# Patient Record
Sex: Male | Born: 1995 | Race: Black or African American | Hispanic: No | Marital: Single | Smoking: Never smoker
Health system: Southern US, Community
[De-identification: ages and names within clinical notes are randomized; demographics above are authoritative.]

## PROBLEM LIST (undated history)

## (undated) DIAGNOSIS — Z0000 Encounter for general adult medical examination without abnormal findings: Secondary | ICD-10-CM

## (undated) HISTORY — DX: Encounter for general adult medical examination without abnormal findings: Z00.00

---
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
## 2017-05-17 ENCOUNTER — Ambulatory Visit: Payer: Self-pay | Admitting: Family Medicine

## 2017-05-19 ENCOUNTER — Ambulatory Visit (INDEPENDENT_AMBULATORY_CARE_PROVIDER_SITE_OTHER): Payer: BLUE CROSS/BLUE SHIELD | Admitting: Family Medicine

## 2017-05-19 ENCOUNTER — Other Ambulatory Visit (HOSPITAL_COMMUNITY)
Admission: RE | Admit: 2017-05-19 | Discharge: 2017-05-19 | Disposition: A | Discharge status: Home / Self Care | Payer: BLUE CROSS/BLUE SHIELD | Source: Ambulatory Visit | Attending: Family Medicine | Admitting: Family Medicine

## 2017-05-19 ENCOUNTER — Encounter: Payer: Self-pay | Admitting: Family Medicine

## 2017-05-19 VITALS — BP 88/60 | HR 87 | Temp 98.3°F | Ht 79.5 in | Wt 201.0 lb

## 2017-05-19 DIAGNOSIS — Z832 Family history of diseases of the blood and blood-forming organs and certain disorders involving the immune mechanism: Secondary | ICD-10-CM

## 2017-05-19 DIAGNOSIS — Z13220 Encounter for screening for lipoid disorders: Secondary | ICD-10-CM

## 2017-05-19 DIAGNOSIS — Z7251 High risk heterosexual behavior: Secondary | ICD-10-CM

## 2017-05-19 DIAGNOSIS — Z0000 Encounter for general adult medical examination without abnormal findings: Secondary | ICD-10-CM | POA: Insufficient documentation

## 2017-05-19 DIAGNOSIS — Z23 Encounter for immunization: Secondary | ICD-10-CM | POA: Diagnosis not present

## 2017-05-19 NOTE — Progress Notes (Signed)
Phone: [phone]  Subjective:  Patient presents today to establish care.  Prior patient of pediatrician. Chief complaint-noted.   See problem oriented charting  The following were reviewed and entered/updated in epic: Past Medical History:  Diagnosis Date  . Healthy adult on routine physical examination    Past Surgical History:  Procedure Laterality Date  . ORIF TIBIA PLATEAU Left 11/13/2015   Procedure: OPEN REDUCTION INTERNAL FIXATION (ORIF) LEFT TIBIAL PLATEAU;  Surgeon: Dannielle Huh, MD;  Location: [facility] OR;  Service: Orthopedics;  Laterality: Left;    Family History  Problem Relation Age of Onset  . Hyperlipidemia Mother   . Anemia Mother        heavy periods  . Healthy Father   . Anemia Sister        heavy periods    Medications- reviewed and updated No current outpatient prescriptions on file.   No current facility-administered medications for this visit.     Allergies-reviewed and updated No Known Allergies  Social History   Social History  . Marital status: Single    Spouse name: N/A  . Number of children: N/A  . Years of education: N/A   Social History Main Topics  . Smoking status: Never Smoker  . Smokeless tobacco: Never Used  . Alcohol use No  . Drug use: No  . Sexual activity: Not Asked   Other Topics Concern  . None   Social History Narrative   Single. 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    Getting scholarship money.    Plans to try to walk on to basketball team.    Fractured tibia 2 years ago- became interested in orthopedics      Hobbies: workout, play basketball, video game- fortnite in 2018, also enjoys 2018    ROS--Full ROS was completed Review of Systems  Constitutional: Negative for chills and fever.  HENT: Negative for hearing loss and tinnitus.   Eyes: Negative for blurred vision and double vision.  Respiratory: Negative for cough and hemoptysis.     Cardiovascular: Negative for chest pain and palpitations.  Gastrointestinal: Negative for heartburn and nausea.  Genitourinary: Negative for dysuria and urgency.  Musculoskeletal: Negative for myalgias and neck pain.  Skin: Negative for itching and rash.  Neurological: Negative for dizziness and headaches.  Endo/Heme/Allergies: Negative for polydipsia. Does not bruise/bleed easily.  Psychiatric/Behavioral: Negative for hallucinations and substance abuse.  not lightheaded with current BP  Objective: BP (!) 88/60 (BP Location: Left Arm, Patient Position: Sitting, Cuff Size: Large)   Pulse 87   Temp 98.3 F (36.8 C) (Oral)   Ht 6' 7.5" (2.019 m)   Wt 201 lb (91.2 kg)   SpO2 98%   BMI 22.36 kg/m  Gen: NAD, resting comfortably HEENT: Mucous membranes are moist. Oropharynx normal. TM normal. Eyes: sclera and lids normal, PERRLA Neck: no thyromegaly, no cervical lymphadenopathy CV: RRR no murmurs rubs or gallops Lungs: CTAB no crackles, wheeze, rhonchi Abdomen: soft/nontender/nondistended/normal bowel sounds. No rebound or guarding.  Ext: no edema Skin: warm, dry Neuro: 5/5 strength in upper and lower extremities, normal gait, normal reflexes  Assessment/Plan:  21 y.o. male presenting for annual physical.  Health Maintenance counseling: 1. Anticipatory guidance: Patient counseled regarding regular dental exams q6 months, eye exams - wears contacts- sees yearly, wearing seatbelts.  2. Risk factor reduction:  Advised patient of need for regular exercise and diet rich and fruits and vegetables to reduce risk  of heart attack and stroke. Exercise- excellent- regularly plays basketball. Diet-balanced diet.  Wt Readings from Last 3 Encounters:  05/19/17 201 lb (91.2 kg) (94 %, Z= 1.54)*  11/13/15 185 lb (83.9 kg) (92 %, Z= 1.37)*  3. Immunizations/screenings/ancillary studies- advised flu shot in the fall. Bexsero and menveo today. Repeat bexsero within 6 months.  4. Prostate cancer  screening- no family history, start at age 19-50 5. Colon cancer screening -  no family history, start at age 19-50 6. Testicular cancer screening- advised monthly self exams  7. STD screening- patient opts in. Discussed HIV screening  Status of chronic or acute concerns   No acute concerns. I was concerned about unprotected sex and encouraged STD screening minus Hep B as immunized- no groin lesions.   Preventative health care - Plan: CBC, Comprehensive metabolic panel, Lipid panel, HIV antibody, RPR, Urine cytology ancillary only  Unprotected sex - Plan: CBC, Comprehensive metabolic panel, HIV antibody, RPR, Urine cytology ancillary only  Screening for hyperlipidemia - Plan: Lipid panel  Family history of anemia - Plan: CBC   Return in about 1 year (around 05/19/2018) for physical.  Orders Placed This Encounter  Procedures  . CBC    [place]  . Comprehensive metabolic panel    [place]    Order Specific Question:   Has the patient fasted?    Answer:   No  . Lipid panel        Order Specific Question:   Has the patient fasted?    Answer:   No  . HIV antibody    [facility]  . R[facility]    [facility]   Return precautions advised.  Tana ConchStephen Ziquan Fidel, MD

## 2017-05-19 NOTE — Patient Instructions (Signed)
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

## 2017-05-19 NOTE — Addendum Note (Signed)
Addended by: Vicente MalesSOUTHERN HIZER, JAMIE M on: 05/19/2017 04:12 PM   Modules accepted: Orders

## 2017-05-20 LAB — COMPREHENSIVE METABOLIC PANEL
ALT: 18 U/L (ref 0–53)
AST: 29 U/L (ref 0–37)
Albumin: 3.9 g/dL (ref 3.5–5.2)
Alkaline Phosphatase: 78 U/L (ref 52–171)
BILIRUBIN TOTAL: 0.5 mg/dL (ref 0.3–1.2)
BUN: 13 mg/dL (ref 6–23)
CALCIUM: 9.4 mg/dL (ref 8.4–10.5)
CHLORIDE: 105 meq/L (ref 96–112)
CO2: 28 meq/L (ref 19–32)
CREATININE: 1.15 mg/dL (ref 0.40–1.50)
GFR: 105.78 mL/min (ref >60.00)
Glucose, Bld: 97 mg/dL (ref 70–99)
Potassium: 4.4 mEq/L (ref 3.5–5.1)
SODIUM: 138 meq/L (ref 135–145)
Total Protein: 6.8 g/dL (ref 6.0–8.3)

## 2017-05-20 LAB — CBC
HCT: 37.5 % (ref 36.0–49.0)
Hemoglobin: 12 g/dL (ref 12.0–16.0)
MCHC: 32.1 g/dL (ref 31.0–37.0)
MCV: 83.2 fl (ref 78.0–98.0)
PLATELETS: 208 10*3/uL (ref 150.0–575.0)
RBC: 4.5 Mil/uL (ref 3.80–5.70)
RDW: 14.7 % (ref 11.4–15.5)
WBC: 5.1 10*3/uL (ref 4.5–13.5)

## 2017-05-20 LAB — HIV ANTIBODY (ROUTINE TESTING W REFLEX): HIV: NONREACTIVE

## 2017-05-20 LAB — RPR

## 2017-05-20 LAB — LIPID PANEL
CHOL/HDL RATIO: 3
Cholesterol: 125 mg/dL (ref 0–200)
HDL: 44.1 mg/dL (ref >39.00)
LDL CALC: 73 mg/dL (ref 0–99)
NONHDL: 80.98
Triglycerides: 41 mg/dL (ref 0.0–149.0)
VLDL: 8.2 mg/dL (ref 0.0–40.0)

## 2017-05-23 LAB — URINE CYTOLOGY ANCILLARY ONLY
CHLAMYDIA, DNA PROBE: NEGATIVE
Neisseria Gonorrhea: NEGATIVE
Trichomonas: NEGATIVE

## 2017-05-26 ENCOUNTER — Telehealth: Payer: Self-pay | Admitting: Family Medicine

## 2017-05-26 NOTE — Telephone Encounter (Signed)
Mom reports he never had HPV #3.   We will plan on giving this along with menveo at follow up.

## 2017-10-21 DIAGNOSIS — R509 Fever, unspecified: Secondary | ICD-10-CM | POA: Diagnosis not present

## 2017-10-21 DIAGNOSIS — Z743 Need for continuous supervision: Secondary | ICD-10-CM | POA: Diagnosis not present

## 2017-10-21 DIAGNOSIS — B349 Viral infection, unspecified: Secondary | ICD-10-CM | POA: Diagnosis not present

## 2017-10-23 DIAGNOSIS — J039 Acute tonsillitis, unspecified: Secondary | ICD-10-CM | POA: Diagnosis not present

## 2020-09-11 DIAGNOSIS — Z113 Encounter for screening for infections with a predominantly sexual mode of transmission: Secondary | ICD-10-CM | POA: Diagnosis not present

## 2021-01-03 DIAGNOSIS — Z113 Encounter for screening for infections with a predominantly sexual mode of transmission: Secondary | ICD-10-CM | POA: Diagnosis not present

## 2021-04-21 DIAGNOSIS — H43392 Other vitreous opacities, left eye: Secondary | ICD-10-CM | POA: Diagnosis not present

## 2021-04-21 DIAGNOSIS — H35413 Lattice degeneration of retina, bilateral: Secondary | ICD-10-CM | POA: Diagnosis not present

## 2021-04-21 DIAGNOSIS — H359 Unspecified retinal disorder: Secondary | ICD-10-CM | POA: Diagnosis not present

## 2021-04-25 DIAGNOSIS — H33323 Round hole, bilateral: Secondary | ICD-10-CM | POA: Diagnosis not present

## 2021-04-25 DIAGNOSIS — H33049 Retinal detachment with retinal dialysis, unspecified eye: Secondary | ICD-10-CM | POA: Diagnosis not present

## 2021-04-30 DIAGNOSIS — H359 Unspecified retinal disorder: Secondary | ICD-10-CM | POA: Diagnosis not present

## 2021-04-30 DIAGNOSIS — H33322 Round hole, left eye: Secondary | ICD-10-CM | POA: Diagnosis not present

## 2021-10-15 DIAGNOSIS — Z111 Encounter for screening for respiratory tuberculosis: Secondary | ICD-10-CM | POA: Diagnosis not present

## 2021-10-18 DIAGNOSIS — Z111 Encounter for screening for respiratory tuberculosis: Secondary | ICD-10-CM | POA: Diagnosis not present

## 2021-12-01 ENCOUNTER — Telehealth: Payer: Self-pay | Admitting: Family Medicine

## 2021-12-01 NOTE — Telephone Encounter (Signed)
Patient last seen by Dr Durene Cal on 05/2017. Patient would like to see Dr Durene Cal again in June/July. Is this ok? (New pt)

## 2021-12-01 NOTE — Telephone Encounter (Signed)
See below

## 2021-12-02 NOTE — Telephone Encounter (Signed)
See below

## 2021-12-02 NOTE — Telephone Encounter (Signed)
Yes thanks 

## 2021-12-02 NOTE — Telephone Encounter (Signed)
Unable to contact patient to sch new patient visit.  ?

## 2021-12-07 NOTE — Telephone Encounter (Signed)
Patient has been scheduled

## 2021-12-28 DIAGNOSIS — R202 Paresthesia of skin: Secondary | ICD-10-CM | POA: Diagnosis not present

## 2022-03-19 ENCOUNTER — Other Ambulatory Visit (HOSPITAL_COMMUNITY)
Admission: RE | Admit: 2022-03-19 | Discharge: 2022-03-19 | Disposition: A | Discharge status: Home / Self Care | Payer: BC Managed Care – PPO | Source: Ambulatory Visit | Attending: Family Medicine | Admitting: Family Medicine

## 2022-03-19 ENCOUNTER — Encounter: Payer: Self-pay | Admitting: Family Medicine

## 2022-03-19 ENCOUNTER — Ambulatory Visit: Payer: BC Managed Care – PPO | Admitting: Family Medicine

## 2022-03-19 VITALS — BP 124/78 | HR 87 | Temp 98.0°F | Ht >= 80 in | Wt 212.1 lb

## 2022-03-19 DIAGNOSIS — Z23 Encounter for immunization: Secondary | ICD-10-CM | POA: Diagnosis not present

## 2022-03-19 DIAGNOSIS — E871 Hypo-osmolality and hyponatremia: Secondary | ICD-10-CM

## 2022-03-19 DIAGNOSIS — Z0000 Encounter for general adult medical examination without abnormal findings: Secondary | ICD-10-CM

## 2022-03-19 DIAGNOSIS — Z118 Encounter for screening for other infectious and parasitic diseases: Secondary | ICD-10-CM | POA: Insufficient documentation

## 2022-03-19 DIAGNOSIS — Z114 Encounter for screening for human immunodeficiency virus [HIV]: Secondary | ICD-10-CM | POA: Diagnosis not present

## 2022-03-19 DIAGNOSIS — Z113 Encounter for screening for infections with a predominantly sexual mode of transmission: Secondary | ICD-10-CM | POA: Insufficient documentation

## 2022-03-19 DIAGNOSIS — Z1159 Encounter for screening for other viral diseases: Secondary | ICD-10-CM

## 2022-03-19 DIAGNOSIS — D649 Anemia, unspecified: Secondary | ICD-10-CM | POA: Diagnosis not present

## 2022-03-19 DIAGNOSIS — Z13220 Encounter for screening for lipoid disorders: Secondary | ICD-10-CM | POA: Diagnosis not present

## 2022-03-19 DIAGNOSIS — Z111 Encounter for screening for respiratory tuberculosis: Secondary | ICD-10-CM

## 2022-03-19 NOTE — Addendum Note (Signed)
Addended by: Candie Chroman on: 03/19/2022 05:15 PM   Modules accepted: Orders

## 2022-03-19 NOTE — Patient Instructions (Addendum)
Glad you are doing so well! Congrats on med school!   Final bexsero today  TB skin test today  Recommended follow up: Return in about 1 year (around 03/20/2023) for physical or sooner if needed.Schedule b4 you leave.

## 2022-03-19 NOTE — Progress Notes (Signed)
Phone: [phone]    Subjective:  Patient presents today for their annual physical. Chief complaint-noted.   See problem oriented charting- ROS- full  review of systems was completed and negative  Per full ROS sheet completed by patient  The following were reviewed and entered/updated in epic: Past Medical History:  Diagnosis Date   Healthy adult on routine physical examination    Patient Active Problem List   Diagnosis Date Noted   Closed fracture of left tibial plateau with routine healing 12/25/2015   Past Surgical History:  Procedure Laterality Date   ORIF TIBIA PLATEAU Left 11/13/2015   Procedure: OPEN REDUCTION INTERNAL FIXATION (ORIF) LEFT TIBIAL PLATEAU;  Surgeon: Dannielle Huh, MD;  Location: [facility] OR;  Service: Orthopedics;  Laterality: Left;    Family History  Problem Relation Age of Onset   Hyperlipidemia Mother    Anemia Mother        heavy periods   Healthy Father    Anemia Sister        heavy periods    Medications- reviewed and updated No current outpatient medications on file.   No current facility-administered medications for this visit.    Allergies-reviewed and updated No Known Allergies  Social History   Social History Narrative   Single. 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 Oropharynx normal Neck: no thyromegaly CV: RRR no murmurs rubs or gallops Lungs: CTAB no crackles, wheeze, rhonchi Abdomen:  soft/nontender/nondistended/normal bowel sounds. No rebound or guarding.  Ext: no edema Skin: warm, dry Neuro: grossly normal, moves all extremities, PERRLA     Assessment and Plan:  26 y.o. male presenting for annual physical.  Health Maintenance counseling: 1. Anticipatory guidance: Patient counseled regarding regular dental exams - advised q6 months, eye exams - yearly typically,  avoiding smoking and second hand smoke , limiting alcohol to 2 beverages per day, no illicit drugs.   2. Risk factor reduction:  Advised patient of need for regular exercise and diet rich and fruits and vegetables to reduce risk of heart attack and stroke.  Exercise- lifting 4 days a week.  Diet/weight management-reasonably healthy.  Wt Readings from Last 3 Encounters:  03/19/22 212 lb 2 oz (96.2 kg)  05/19/17 201 lb (91.2 kg) (94 %, Z= 1.54)*  11/13/15 185 lb (83.9 kg) (92 %, Z= 1.37)*   * Growth percentiles are based on CDC (Boys, 2-20 Years) data.  3. Immunizations/screenings/ancillary studies Immunization History  Administered Date(s) Administered   Meningococcal B, OMV 05/19/2017, 03/19/2022   Meningococcal Mcv4o 05/19/2017   Moderna Sars-Covid-2 Vaccination 11/13/2019   Tdap 05/04/2010, 10/15/2021  4. Prostate cancer screening- no family history, start at age 85  5. Colon cancer screening - no family history, start at age 54 6. Skin cancer screening/prevention- lower risk due to melanin content. advised regular sunscreen use. Denies worrisome, changing, or new skin lesions.  7. Testicular cancer screening- advised monthly self exams  8. STD screening- patient opts in- but is only active with GF- still advised protection (only using pull out sometimes) 9. Smoking associated screening- Never smoker  Status of chronic or acute concerns   Last screening for hyperlipidemia was in 2018-we will update with labs today  Patient had been sick a few years ago at [facility] and in labs we see that he had  some mild anemia-we are asked to check a CBC to make sure this is improved.  Also had some hyponatremia and though likely related to illness we are going to check a CMP to make sure it has resolved.  Filled out mid school forms today for immunizations-needs a follow-up TB skin test #2 as well-she will return on Monday for this to be read with a nurse visit  Recommended follow up: Return in about 1 year (around 03/20/2023) for physical or sooner if needed.Schedule b4 you leave.  Lab/Order associations:NOT fasting   ICD-10-CM   1. Preventative health care  Z00.00 CBC with Differential/Platelet    Comprehensive metabolic panel    Lipid panel    Lipid panel    Comprehensive metabolic panel    CBC with Differential/Platelet    2. Screening for HIV (human immunodeficiency virus)  Z11.4 HIV Antibody (routine testing w rflx)    HIV Antibody (routine testing w rflx)    3. Screening examination for venereal disease  Z11.3 RPR    RPR    4. Screening for gonorrhea  Z11.3 Urine cytology ancillary only    Urine cytology ancillary only    5. Screening for chlamydial disease  Z11.8 Urine cytology ancillary only    Urine cytology ancillary only    6. Anemia, unspecified type  D64.9 CBC with Differential/Platelet    CBC with Differential/Platelet    7. Hyponatremia  E87.1 Comprehensive metabolic panel    Comprehensive metabolic panel    8. Screening for hyperlipidemia  Z13.220 Lipid panel    Lipid panel    9. Encounter for hepatitis C screening test for low risk patient  Z11.59 Hepatitis C antibody    Hepatitis C antibody    10. Need for meningococcal vaccination  Z23 Meningococcal B, OMV (Bexsero)      No orders of the defined types were placed in this encounter.   Return precautions advised.   Tana Conch, MD

## 2022-03-20 LAB — LIPID PANEL
Cholesterol: 179 mg/dL (ref <200)
HDL: 63 mg/dL (ref >=40)
LDL Cholesterol (Calc): 101 mg/dL (calc) — ABNORMAL HIGH
Non-HDL Cholesterol (Calc): 116 mg/dL (calc) (ref <130)
Total CHOL/HDL Ratio: 2.8 (calc) (ref <5.0)
Triglycerides: 64 mg/dL (ref <150)

## 2022-03-20 LAB — CBC WITH DIFFERENTIAL/PLATELET
Absolute Monocytes: 450 cells/uL (ref 200–950)
HCT: 37.7 % — ABNORMAL LOW (ref 38.5–50.0)
Hemoglobin: 12.3 g/dL — ABNORMAL LOW (ref 13.2–17.1)
MCH: 27 pg (ref 27.0–33.0)
MCV: 82.9 fL (ref 80.0–100.0)
MPV: 12.6 fL — ABNORMAL HIGH (ref 7.5–12.5)
Neutro Abs: 5214 cells/uL (ref 1500–7800)
Platelets: 230 10*3/uL (ref 140–400)
RBC: 4.55 10*6/uL (ref 4.20–5.80)

## 2022-03-20 LAB — COMPREHENSIVE METABOLIC PANEL
AG Ratio: 1.4 (calc) (ref 1.0–2.5)
ALT: 23 U/L (ref 9–46)
AST: 23 U/L (ref 10–40)
Albumin: 4.3 g/dL (ref 3.6–5.1)
BUN/Creatinine Ratio: 11 (calc) (ref 6–22)
CO2: 25 mmol/L (ref 20–32)
Calcium: 9.9 mg/dL (ref 8.6–10.3)
Creat: 1.28 mg/dL — ABNORMAL HIGH (ref 0.60–1.24)
Globulin: 3.1 g/dL (calc) (ref 1.9–3.7)
Sodium: 137 mmol/L (ref 135–146)
Total Bilirubin: 0.6 mg/dL (ref 0.2–1.2)

## 2022-03-20 LAB — RPR: RPR Ser Ql: NONREACTIVE

## 2022-03-22 LAB — CBC WITH DIFFERENTIAL/PLATELET
Basophils Absolute: 40 cells/uL (ref 0–200)
Basophils Relative: 0.5 %
Eosinophils Absolute: 47 cells/uL (ref 15–500)
Eosinophils Relative: 0.6 %
Lymphs Abs: 2149 cells/uL (ref 850–3900)
MCHC: 32.6 g/dL (ref 32.0–36.0)
Monocytes Relative: 5.7 %
Neutrophils Relative %: 66 %
RDW: 14.4 % (ref 11.0–15.0)
Total Lymphocyte: 27.2 %
WBC: 7.9 10*3/uL (ref 3.8–10.8)

## 2022-03-22 LAB — COMPREHENSIVE METABOLIC PANEL
Alkaline phosphatase (APISO): 61 U/L (ref 36–130)
BUN: 14 mg/dL (ref 7–25)
Chloride: 104 mmol/L (ref 98–110)
Glucose, Bld: 93 mg/dL (ref 65–99)
Potassium: 4.1 mmol/L (ref 3.5–5.3)
Total Protein: 7.4 g/dL (ref 6.1–8.1)

## 2022-03-22 LAB — TB SKIN TEST
Induration: NEGATIVE mm
TB Skin Test: NEGATIVE

## 2022-03-22 LAB — HEPATITIS C ANTIBODY
Hepatitis C Ab: NONREACTIVE
SIGNAL TO CUT-OFF: 0.14 (ref <1.00)

## 2022-03-22 LAB — HIV ANTIBODY (ROUTINE TESTING W REFLEX): HIV 1&2 Ab, 4th Generation: NONREACTIVE

## 2022-03-23 LAB — URINE CYTOLOGY ANCILLARY ONLY
Chlamydia: NEGATIVE
Comment: NEGATIVE
Comment: NEGATIVE
Comment: NORMAL
Neisseria Gonorrhea: NEGATIVE
Trichomonas: NEGATIVE

## 2022-03-29 ENCOUNTER — Encounter: Payer: Self-pay | Admitting: Family Medicine

## 2022-03-29 ENCOUNTER — Other Ambulatory Visit: Payer: Self-pay | Admitting: Family Medicine

## 2022-03-29 DIAGNOSIS — Z789 Other specified health status: Secondary | ICD-10-CM

## 2022-03-30 ENCOUNTER — Other Ambulatory Visit: Payer: Self-pay

## 2022-04-06 NOTE — Telephone Encounter (Signed)
Unfortunately hepatitis B surface antibody is negative-he will therefore need hepatitis B immunizations at 0, 1 and 6 months.  [facility] labs unfortunately not showing up on epic but I received a copy

## 2022-06-28 ENCOUNTER — Encounter: Payer: Self-pay | Admitting: *Deleted

## 2022-09-09 DIAGNOSIS — H359 Unspecified retinal disorder: Secondary | ICD-10-CM | POA: Diagnosis not present

## 2022-09-09 DIAGNOSIS — H33322 Round hole, left eye: Secondary | ICD-10-CM | POA: Diagnosis not present

## 2022-09-16 ENCOUNTER — Encounter: Payer: Self-pay | Admitting: *Deleted

## 2023-12-29 ENCOUNTER — Other Ambulatory Visit: Payer: Self-pay

## 2023-12-29 ENCOUNTER — Encounter: Payer: Self-pay | Admitting: Family Medicine

## 2023-12-29 ENCOUNTER — Other Ambulatory Visit (HOSPITAL_COMMUNITY)
Admission: RE | Admit: 2023-12-29 | Discharge: 2023-12-29 | Disposition: A | Discharge status: Home / Self Care | Source: Ambulatory Visit | Attending: Family Medicine | Admitting: Family Medicine

## 2023-12-29 ENCOUNTER — Ambulatory Visit (INDEPENDENT_AMBULATORY_CARE_PROVIDER_SITE_OTHER): Admitting: Family Medicine

## 2023-12-29 VITALS — BP 106/60 | HR 61 | Temp 97.9°F | Ht >= 80 in | Wt 207.0 lb

## 2023-12-29 DIAGNOSIS — Z113 Encounter for screening for infections with a predominantly sexual mode of transmission: Secondary | ICD-10-CM | POA: Insufficient documentation

## 2023-12-29 DIAGNOSIS — Z13220 Encounter for screening for lipoid disorders: Secondary | ICD-10-CM

## 2023-12-29 DIAGNOSIS — Z118 Encounter for screening for other infectious and parasitic diseases: Secondary | ICD-10-CM

## 2023-12-29 DIAGNOSIS — D649 Anemia, unspecified: Secondary | ICD-10-CM

## 2023-12-29 DIAGNOSIS — Z0000 Encounter for general adult medical examination without abnormal findings: Secondary | ICD-10-CM | POA: Diagnosis not present

## 2023-12-29 DIAGNOSIS — Z114 Encounter for screening for human immunodeficiency virus [HIV]: Secondary | ICD-10-CM

## 2023-12-29 LAB — COMPREHENSIVE METABOLIC PANEL WITH GFR
ALT: 16 U/L (ref 0–53)
AST: 27 U/L (ref 0–37)
Albumin: 4.3 g/dL (ref 3.5–5.2)
Alkaline Phosphatase: 50 U/L (ref 39–117)
BUN: 16 mg/dL (ref 6–23)
CO2: 29 meq/L (ref 19–32)
Calcium: 9.7 mg/dL (ref 8.4–10.5)
Chloride: 103 meq/L (ref 96–112)
Creatinine, Ser: 1.32 mg/dL (ref 0.40–1.50)
GFR: 75.11 mL/min (ref >60.00)
Glucose, Bld: 85 mg/dL (ref 70–99)
Potassium: 4 meq/L (ref 3.5–5.1)
Sodium: 138 meq/L (ref 135–145)
Total Bilirubin: 0.5 mg/dL (ref 0.2–1.2)
Total Protein: 7.9 g/dL (ref 6.0–8.3)

## 2023-12-29 LAB — CBC WITH DIFFERENTIAL/PLATELET
Basophils Absolute: 0 10*3/uL (ref 0.0–0.1)
Basophils Relative: 0.9 % (ref 0.0–3.0)
Eosinophils Absolute: 0.1 10*3/uL (ref 0.0–0.7)
Eosinophils Relative: 1.5 % (ref 0.0–5.0)
HCT: 37.1 % — ABNORMAL LOW (ref 39.0–52.0)
Hemoglobin: 12.3 g/dL — ABNORMAL LOW (ref 13.0–17.0)
Lymphocytes Relative: 46.2 % — ABNORMAL HIGH (ref 12.0–46.0)
Lymphs Abs: 1.7 10*3/uL (ref 0.7–4.0)
MCHC: 33.1 g/dL (ref 30.0–36.0)
MCV: 82.3 fl (ref 78.0–100.0)
Monocytes Absolute: 0.3 10*3/uL (ref 0.1–1.0)
Monocytes Relative: 7.5 % (ref 3.0–12.0)
Neutro Abs: 1.7 10*3/uL (ref 1.4–7.7)
Neutrophils Relative %: 43.9 % (ref 43.0–77.0)
Platelets: 212 10*3/uL (ref 150.0–400.0)
RBC: 4.51 Mil/uL (ref 4.22–5.81)
RDW: 14.7 % (ref 11.5–15.5)
WBC: 3.8 10*3/uL — ABNORMAL LOW (ref 4.0–10.5)

## 2023-12-29 LAB — LIPID PANEL
Cholesterol: 160 mg/dL (ref 0–200)
HDL: 59.9 mg/dL (ref >39.00)
LDL Cholesterol: 93 mg/dL (ref 0–99)
NonHDL: 99.83
Total CHOL/HDL Ratio: 3
Triglycerides: 34 mg/dL (ref 0.0–149.0)
VLDL: 6.8 mg/dL (ref 0.0–40.0)

## 2023-12-29 LAB — URINE CYTOLOGY ANCILLARY ONLY
Chlamydia: NEGATIVE
Comment: NEGATIVE
Comment: NEGATIVE
Comment: NORMAL
Neisseria Gonorrhea: NEGATIVE
Trichomonas: NEGATIVE

## 2023-12-29 NOTE — Patient Instructions (Addendum)
 Please stop by lab before you go If you have mychart- we will send your results within 3 business days of [place] receiving them.  If you do not have mychart- we will call you about results within 5 business days of [place] receiving them.  *please also note that you will see labs on mychart as soon as they post. I will later go in and write notes on them- will say "notes from Dr. Durene Cal"    Recommended follow up: Return in about 2 years (around 12/28/2025) for physical or sooner if needed.Schedule b4 you leave.

## 2023-12-29 NOTE — Progress Notes (Signed)
 Phone: [phone]    Subjective:  Patient presents today for their annual physical. Chief complaint-noted.   See problem oriented charting- ROS- full  review of systems was completed and negative  Per full ROS sheet completed by patient  The following were reviewed and entered/updated in epic: Past Medical History:  Diagnosis Date   Healthy adult on routine physical examination    Patient Active Problem List   Diagnosis Date Noted   Closed fracture of left tibial plateau with routine healing 12/25/2015   Past Surgical History:  Procedure Laterality Date   ORIF TIBIA PLATEAU Left 11/13/2015   Procedure: OPEN REDUCTION INTERNAL FIXATION (ORIF) LEFT TIBIAL PLATEAU;  Surgeon: Dannielle Huh, MD;  Location: [facility] OR;  Service: Orthopedics;  Laterality: Left;    Family History  Problem Relation Age of Onset   Hyperlipidemia Mother    Anemia Mother        heavy periods   Healthy Father    Anemia Sister        heavy periods   Stroke Maternal Grandfather        in 1s    Medications- reviewed and updated No current outpatient medications on file.   No current facility-administered medications for this visit.    Allergies-reviewed and updated No Known Allergies  Social History   Social History Narrative   Single. 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 Oropharynx normal Neck: no thyromegaly CV: RRR no murmurs rubs or gallops Lungs: CTAB no crackles, wheeze, rhonchi Abdomen: soft/nontender/nondistended/normal bowel sounds. No  rebound or guarding.  Ext: no edema Skin: warm, dry Neuro: grossly normal, moves all extremities, PERRLA Declines genitourinary and rectal     Assessment and Plan:  28 y.o. male presenting for annual physical.  Health Maintenance counseling: 1. Anticipatory guidance: Patient counseled regarding regular dental exams - advised q6 months- scheduled for monday, eye exams - yearly typically,  avoiding smoking and second hand smoke , limiting alcohol to 2 beverages per day- very rare, no illicit drugs.   2. Risk factor reduction:  Advised patient of need for regular exercise and diet rich and fruits and vegetables to reduce risk of heart attack and stroke.  Exercise- 5 days a week lifting and some cardio.  Diet/weight management-weight down 5 pounds from last visit-tries eat reasonably healthy- tries to eat good grains and good amount of vegetables- and tried to increase iron intake.  Wt Readings from Last 3 Encounters:  12/29/23 207 lb (93.9 kg)  03/19/22 212 lb 2 oz (96.2 kg)  05/19/17 201 lb (91.2 kg) (94%, Z= 1.54)*   * Growth percentiles are based on CDC (Boys, 2-20 Years) data.  3. Immunizations/screenings/ancillary studies-fully up to date - human papilloma virus was before sexually active Immunization History  Administered Date(s) Administered   DTaP 01/01/1999, 03/19/1999, 05/28/1999, 11/30/1999   Dtap, Unspecified 11/23/2002   HIB (PRP-OMP) 05/28/1999   HPV Quadrivalent 04/18/2014, 06/21/2014   Hep A,  Unspecified 12/08/2000   Hep B, Unspecified 03-16-98, 11/07/1998, 05/28/1999   Hepatitis A, Ped/Adol-2 Dose 05/04/2010   IPV 01/01/1999, 03/19/1999, 10/26/1999   Influenza Nasal 07/24/2008   Influenza,inj,Quad PF,6+ Mos 06/21/2014   Influenza-Unspecified 10/26/1999, 12/28/1999, 07/18/2023   MMR 10/26/1999, 11/23/2002   Meningococcal B, OMV 05/19/2017, 03/19/2022   Meningococcal Conjugate 05/04/2010   Meningococcal Mcv4o 05/19/2017   Moderna Covid-19 Fall Seasonal Vaccine 5yrs  & older 07/18/2023   Moderna Sars-Covid-2 Vaccination 11/13/2019   PPD Test 03/19/2022   Pneumococcal Conjugate PCV 7 10/26/1999, 11/30/1999, 01/28/2000   Polio, Unspecified 11/23/2002   Tdap 05/04/2010, 10/15/2021   Varicella 10/26/1999, 05/04/2010   Yellow Fever 12/08/2000  4. Prostate cancer screening- no family history, start at age 20 5. Colon cancer screening -  no family history, start at age 6  6. Skin cancer screening/prevention- lower risk due to melanin content. advised regular sunscreen use. Denies worrisome, changing, or new skin lesions.  7. Testicular cancer screening- advised monthly self exams  8. STD screening- patient opts in - active with girlfriend only but still encouraged protection 9. 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 Present for a few years. Never tried anything. May try topical antifungal twice daily for a few weeks to see if that helps- could be tinea versicolor- if doesn't respond- we could refer to dermatology   Recommended follow up: Return in about 2 years (around 12/28/2025) for physical or sooner if needed.Schedule b4 you leave. Happy to see him yearly  Lab/Order associations: fasting other than black coffee   ICD-10-CM   1. Preventative health care  Z00.00     2. Screening for hyperlipidemia  Z13.220 Comprehensive metabolic panel with GFR    Lipid panel    3. Anemia, unspecified type  D64.9 CBC with Differential/Platelet    IBC + Ferritin    Fecal occult blood,  imunochemical    4. Screening for HIV (human immunodeficiency virus)  Z11.4 HIV Antibody (routine testing w rflx)    5. Screening examination for venereal disease  Z11.3 RPR    6. Screening for gonorrhea  Z11.3 Urine cytology ancillary only    7. Screening for chlamydial disease  Z11.8 Urine cytology ancillary only      No orders of the defined types were placed in this encounter.   Return precautions advised.   Tana Conch, MD

## 2023-12-30 ENCOUNTER — Encounter: Payer: Self-pay | Admitting: Family Medicine

## 2023-12-30 LAB — IBC + FERRITIN
Ferritin: 16.8 ng/mL — ABNORMAL LOW (ref 22.0–322.0)
Iron: 74 ug/dL (ref 42–165)
Saturation Ratios: 18.7 % — ABNORMAL LOW (ref 20.0–50.0)
TIBC: 396.2 ug/dL (ref 250.0–450.0)
Transferrin: 283 mg/dL (ref 212.0–360.0)

## 2023-12-30 LAB — RPR: RPR Ser Ql: NONREACTIVE

## 2023-12-30 LAB — HIV ANTIBODY (ROUTINE TESTING W REFLEX): HIV 1&2 Ab, 4th Generation: NONREACTIVE

## 2024-01-02 ENCOUNTER — Encounter: Payer: Self-pay | Admitting: Family Medicine

## 2024-01-02 ENCOUNTER — Ambulatory Visit (INDEPENDENT_AMBULATORY_CARE_PROVIDER_SITE_OTHER)

## 2024-01-02 DIAGNOSIS — D649 Anemia, unspecified: Secondary | ICD-10-CM

## 2024-01-02 LAB — FECAL OCCULT BLOOD, IMMUNOCHEMICAL: Fecal Occult Bld: NEGATIVE

## 2024-04-20 ENCOUNTER — Encounter: Payer: Self-pay | Admitting: Advanced Practice Midwife
